# Patient Record
Sex: Female | Born: 1970 | Race: Black or African American | Hispanic: No | Marital: Married | Smoking: Never smoker
Health system: Southern US, Community
[De-identification: ages and names within clinical notes are randomized; demographics above are authoritative.]

---

## 2017-01-16 ENCOUNTER — Encounter (HOSPITAL_BASED_OUTPATIENT_CLINIC_OR_DEPARTMENT_OTHER): Payer: Self-pay

## 2017-01-16 ENCOUNTER — Emergency Department (HOSPITAL_BASED_OUTPATIENT_CLINIC_OR_DEPARTMENT_OTHER)
Admission: EM | Admit: 2017-01-16 | Discharge: 2017-01-17 | Disposition: A | Discharge status: Home / Self Care | Payer: No Typology Code available for payment source | Attending: Emergency Medicine | Admitting: Emergency Medicine

## 2017-01-16 ENCOUNTER — Emergency Department (HOSPITAL_BASED_OUTPATIENT_CLINIC_OR_DEPARTMENT_OTHER): Payer: No Typology Code available for payment source

## 2017-01-16 DIAGNOSIS — S0990XA Unspecified injury of head, initial encounter: Secondary | ICD-10-CM | POA: Diagnosis not present

## 2017-01-16 DIAGNOSIS — S20219A Contusion of unspecified front wall of thorax, initial encounter: Secondary | ICD-10-CM | POA: Diagnosis not present

## 2017-01-16 DIAGNOSIS — Y92410 Unspecified street and highway as the place of occurrence of the external cause: Secondary | ICD-10-CM | POA: Insufficient documentation

## 2017-01-16 DIAGNOSIS — Y939 Activity, unspecified: Secondary | ICD-10-CM | POA: Insufficient documentation

## 2017-01-16 DIAGNOSIS — Y999 Unspecified external cause status: Secondary | ICD-10-CM | POA: Insufficient documentation

## 2017-01-16 DIAGNOSIS — S299XXA Unspecified injury of thorax, initial encounter: Secondary | ICD-10-CM | POA: Diagnosis present

## 2017-01-16 MED ORDER — IBUPROFEN 400 MG PO TABS
600.0000 mg | ORAL_TABLET | Freq: Once | ORAL | Status: AC
  Administered 2017-01-16: 600 mg via ORAL
  Filled 2017-01-16: qty 1

## 2017-01-16 MED ORDER — ACETAMINOPHEN 325 MG PO TABS
650.0000 mg | ORAL_TABLET | Freq: Once | ORAL | Status: AC
  Administered 2017-01-16: 650 mg via ORAL
  Filled 2017-01-16: qty 2
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
## 2017-01-16 NOTE — ED Notes (Signed)
ED Provider at bedside. 

## 2017-01-16 NOTE — ED Notes (Signed)
Patient transported to X-ray 
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
